# Patient Record
Sex: Female | Born: 1935 | Race: White | Hispanic: No | Marital: Married
Health system: Southern US, Community
[De-identification: ages and names within clinical notes are randomized; demographics above are authoritative.]

---

## 2008-10-16 ENCOUNTER — Ambulatory Visit: Payer: Self-pay | Admitting: Internal Medicine

## 2009-05-24 ENCOUNTER — Ambulatory Visit: Payer: Self-pay | Admitting: Unknown Physician Specialty

## 2010-02-07 ENCOUNTER — Ambulatory Visit: Payer: Self-pay | Admitting: Internal Medicine

## 2011-05-17 ENCOUNTER — Emergency Department: Payer: Self-pay | Admitting: Emergency Medicine

## 2012-07-14 ENCOUNTER — Emergency Department: Payer: Self-pay | Admitting: Emergency Medicine

## 2012-07-14 LAB — COMPREHENSIVE METABOLIC PANEL
Albumin: 3.4 g/dL (ref 3.4–5.0)
Alkaline Phosphatase: 126 U/L (ref 50–136)
Anion Gap: 8 (ref 7–16)
BUN: 13 mg/dL (ref 7–18)
Bilirubin,Total: 0.5 mg/dL (ref 0.2–1.0)
Creatinine: 0.63 mg/dL (ref 0.60–1.30)
EGFR (African American): >60
Glucose: 186 mg/dL — ABNORMAL HIGH (ref 65–99)
Osmolality: 279 (ref 275–301)
Potassium: 3.8 mmol/L (ref 3.5–5.1)
SGPT (ALT): 22 U/L (ref 12–78)
Sodium: 137 mmol/L (ref 136–145)

## 2012-07-14 LAB — CBC
HCT: 40.4 % (ref 35.0–47.0)
HGB: 13.7 g/dL (ref 12.0–16.0)
MCH: 33 pg (ref 26.0–34.0)
MCHC: 33.8 g/dL (ref 32.0–36.0)
MCV: 98 fL (ref 80–100)
Platelet: 272 10*3/uL (ref 150–440)
RBC: 4.14 10*6/uL (ref 3.80–5.20)
RDW: 12.7 % (ref 11.5–14.5)

## 2012-07-14 LAB — URINALYSIS, COMPLETE
Bilirubin,UR: NEGATIVE
Blood: NEGATIVE
Ketone: NEGATIVE
Nitrite: NEGATIVE
Protein: NEGATIVE
Squamous Epithelial: 1

## 2014-05-26 ENCOUNTER — Ambulatory Visit: Payer: Self-pay | Admitting: Internal Medicine

## 2014-06-09 ENCOUNTER — Emergency Department: Payer: Self-pay | Admitting: Emergency Medicine

## 2014-06-09 LAB — CBC WITH DIFFERENTIAL/PLATELET
BASOS ABS: 0 10*3/uL (ref 0.0–0.1)
Basophil %: 0.3 %
EOS ABS: 0.1 10*3/uL (ref 0.0–0.7)
Eosinophil %: 1.3 %
HCT: 40.7 % (ref 35.0–47.0)
HGB: 13.4 g/dL (ref 12.0–16.0)
LYMPHS ABS: 1.2 10*3/uL (ref 1.0–3.6)
Lymphocyte %: 13.7 %
MCH: 32.9 pg (ref 26.0–34.0)
MCHC: 32.8 g/dL (ref 32.0–36.0)
MCV: 100 fL (ref 80–100)
MONO ABS: 0.8 x10 3/mm (ref 0.2–0.9)
MONOS PCT: 9.4 %
NEUTROS ABS: 6.6 10*3/uL — AB (ref 1.4–6.5)
Neutrophil %: 75.3 %
PLATELETS: 220 10*3/uL (ref 150–440)
RBC: 4.06 10*6/uL (ref 3.80–5.20)
RDW: 12.8 % (ref 11.5–14.5)
WBC: 8.7 10*3/uL (ref 3.6–11.0)

## 2014-06-09 LAB — COMPREHENSIVE METABOLIC PANEL
Albumin: 3.4 g/dL (ref 3.4–5.0)
Alkaline Phosphatase: 97 U/L
Anion Gap: 3 — ABNORMAL LOW (ref 7–16)
BILIRUBIN TOTAL: 0.5 mg/dL (ref 0.2–1.0)
BUN: 16 mg/dL (ref 7–18)
CHLORIDE: 102 mmol/L (ref 98–107)
CREATININE: 0.85 mg/dL (ref 0.60–1.30)
Calcium, Total: 9 mg/dL (ref 8.5–10.1)
Co2: 32 mmol/L (ref 21–32)
EGFR (African American): >60
EGFR (Non-African Amer.): >60
Glucose: 204 mg/dL — ABNORMAL HIGH (ref 65–99)
Osmolality: 281 (ref 275–301)
Potassium: 4.3 mmol/L (ref 3.5–5.1)
SGOT(AST): 18 U/L (ref 15–37)
SGPT (ALT): 20 U/L
SODIUM: 137 mmol/L (ref 136–145)
Total Protein: 6.9 g/dL (ref 6.4–8.2)

## 2014-06-12 ENCOUNTER — Inpatient Hospital Stay: Payer: Self-pay | Admitting: Family Medicine

## 2014-06-12 LAB — URINALYSIS, COMPLETE
Bilirubin,UR: NEGATIVE
Blood: NEGATIVE
Glucose,UR: >=500 mg/dL (ref 0–75)
NITRITE: NEGATIVE
PROTEIN: NEGATIVE
Ph: 5 (ref 4.5–8.0)
RBC,UR: 33 /HPF (ref 0–5)
Specific Gravity: 1.023 (ref 1.003–1.030)
Squamous Epithelial: 4

## 2014-06-12 LAB — CBC WITH DIFFERENTIAL/PLATELET
BASOS ABS: 0 10*3/uL (ref 0.0–0.1)
Basophil %: 0.4 %
EOS ABS: 0.1 10*3/uL (ref 0.0–0.7)
Eosinophil %: 1.1 %
HCT: 42.7 % (ref 35.0–47.0)
HGB: 14.3 g/dL (ref 12.0–16.0)
LYMPHS PCT: 18.6 %
Lymphocyte #: 1.7 10*3/uL (ref 1.0–3.6)
MCH: 33.2 pg (ref 26.0–34.0)
MCHC: 33.4 g/dL (ref 32.0–36.0)
MCV: 99 fL (ref 80–100)
Monocyte #: 1.1 x10 3/mm — ABNORMAL HIGH (ref 0.2–0.9)
Monocyte %: 11.8 %
Neutrophil #: 6.3 10*3/uL (ref 1.4–6.5)
Neutrophil %: 68.1 %
Platelet: 225 10*3/uL (ref 150–440)
RBC: 4.29 10*6/uL (ref 3.80–5.20)
RDW: 12.7 % (ref 11.5–14.5)
WBC: 9.2 10*3/uL (ref 3.6–11.0)

## 2014-06-12 LAB — BASIC METABOLIC PANEL
ANION GAP: 6 — AB (ref 7–16)
BUN: 15 mg/dL (ref 7–18)
CALCIUM: 9.3 mg/dL (ref 8.5–10.1)
CO2: 30 mmol/L (ref 21–32)
Chloride: 101 mmol/L (ref 98–107)
Creatinine: 0.79 mg/dL (ref 0.60–1.30)
EGFR (African American): >60
EGFR (Non-African Amer.): >60
Glucose: 275 mg/dL — ABNORMAL HIGH (ref 65–99)
Osmolality: 284 (ref 275–301)
Potassium: 4.2 mmol/L (ref 3.5–5.1)
SODIUM: 137 mmol/L (ref 136–145)

## 2014-06-14 LAB — CBC WITH DIFFERENTIAL/PLATELET
Basophil #: 0 10*3/uL (ref 0.0–0.1)
Basophil %: 0.1 %
EOS PCT: 0.3 %
Eosinophil #: 0 10*3/uL (ref 0.0–0.7)
HCT: 43.2 % (ref 35.0–47.0)
HGB: 14.1 g/dL (ref 12.0–16.0)
LYMPHS PCT: 1.9 %
Lymphocyte #: 0.3 10*3/uL — ABNORMAL LOW (ref 1.0–3.6)
MCH: 32.9 pg (ref 26.0–34.0)
MCHC: 32.7 g/dL (ref 32.0–36.0)
MCV: 101 fL — AB (ref 80–100)
MONO ABS: 0.8 x10 3/mm (ref 0.2–0.9)
MONOS PCT: 4.8 %
Neutrophil #: 15.4 10*3/uL — ABNORMAL HIGH (ref 1.4–6.5)
Neutrophil %: 92.9 %
PLATELETS: 202 10*3/uL (ref 150–440)
RBC: 4.3 10*6/uL (ref 3.80–5.20)
RDW: 12.9 % (ref 11.5–14.5)
WBC: 16.6 10*3/uL — AB (ref 3.6–11.0)

## 2014-06-14 LAB — BASIC METABOLIC PANEL
Anion Gap: 8 (ref 7–16)
BUN: 14 mg/dL (ref 7–18)
CALCIUM: 8.7 mg/dL (ref 8.5–10.1)
Chloride: 103 mmol/L (ref 98–107)
Co2: 26 mmol/L (ref 21–32)
Creatinine: 0.84 mg/dL (ref 0.60–1.30)
EGFR (Non-African Amer.): >60
GLUCOSE: 209 mg/dL — AB (ref 65–99)
OSMOLALITY: 280 (ref 275–301)
Potassium: 4.5 mmol/L (ref 3.5–5.1)
Sodium: 137 mmol/L (ref 136–145)

## 2014-06-14 LAB — URINE CULTURE

## 2014-06-14 LAB — CLOSTRIDIUM DIFFICILE(ARMC)

## 2014-06-15 LAB — CBC WITH DIFFERENTIAL/PLATELET
BASOS ABS: 0 10*3/uL (ref 0.0–0.1)
Basophil %: 0.5 %
Eosinophil #: 0.1 10*3/uL (ref 0.0–0.7)
Eosinophil %: 1.5 %
HCT: 40.9 % (ref 35.0–47.0)
HGB: 13.6 g/dL (ref 12.0–16.0)
LYMPHS ABS: 1.2 10*3/uL (ref 1.0–3.6)
LYMPHS PCT: 16.3 %
MCH: 33.2 pg (ref 26.0–34.0)
MCHC: 33.2 g/dL (ref 32.0–36.0)
MCV: 100 fL (ref 80–100)
MONO ABS: 1 x10 3/mm — AB (ref 0.2–0.9)
Monocyte %: 13.3 %
NEUTROS ABS: 5.2 10*3/uL (ref 1.4–6.5)
Neutrophil %: 68.4 %
Platelet: 185 10*3/uL (ref 150–440)
RBC: 4.09 10*6/uL (ref 3.80–5.20)
RDW: 12.8 % (ref 11.5–14.5)
WBC: 7.6 10*3/uL (ref 3.6–11.0)

## 2014-06-15 LAB — VANCOMYCIN, TROUGH: Vancomycin, Trough: 11 ug/mL (ref 10–20)

## 2014-06-16 LAB — CBC WITH DIFFERENTIAL/PLATELET
BASOS PCT: 0.4 %
Basophil #: 0 10*3/uL (ref 0.0–0.1)
EOS ABS: 0.2 10*3/uL (ref 0.0–0.7)
EOS PCT: 2.5 %
HCT: 39.3 % (ref 35.0–47.0)
HGB: 13.2 g/dL (ref 12.0–16.0)
Lymphocyte #: 1.5 10*3/uL (ref 1.0–3.6)
Lymphocyte %: 18.7 %
MCH: 33.2 pg (ref 26.0–34.0)
MCHC: 33.5 g/dL (ref 32.0–36.0)
MCV: 99 fL (ref 80–100)
Monocyte #: 1.4 x10 3/mm — ABNORMAL HIGH (ref 0.2–0.9)
Monocyte %: 16.8 %
NEUTROS ABS: 5 10*3/uL (ref 1.4–6.5)
NEUTROS PCT: 61.6 %
Platelet: 180 10*3/uL (ref 150–440)
RBC: 3.96 10*6/uL (ref 3.80–5.20)
RDW: 12.6 % (ref 11.5–14.5)
WBC: 8.1 10*3/uL (ref 3.6–11.0)

## 2014-06-16 LAB — BASIC METABOLIC PANEL
Anion Gap: 4 — ABNORMAL LOW (ref 7–16)
BUN: 13 mg/dL (ref 7–18)
CALCIUM: 8.5 mg/dL (ref 8.5–10.1)
CREATININE: 0.77 mg/dL (ref 0.60–1.30)
Chloride: 105 mmol/L (ref 98–107)
Co2: 28 mmol/L (ref 21–32)
EGFR (African American): >60
EGFR (Non-African Amer.): >60
GLUCOSE: 127 mg/dL — AB (ref 65–99)
Osmolality: 276 (ref 275–301)
Potassium: 3.9 mmol/L (ref 3.5–5.1)
SODIUM: 137 mmol/L (ref 136–145)

## 2014-06-17 LAB — CBC WITH DIFFERENTIAL/PLATELET
Basophil #: 0 10*3/uL (ref 0.0–0.1)
Basophil %: 0.3 %
Eosinophil #: 0.1 10*3/uL (ref 0.0–0.7)
Eosinophil %: 1.5 %
HCT: 41 % (ref 35.0–47.0)
HGB: 13.7 g/dL (ref 12.0–16.0)
Lymphocyte #: 1.8 10*3/uL (ref 1.0–3.6)
Lymphocyte %: 21.3 %
MCH: 32.8 pg (ref 26.0–34.0)
MCHC: 33.3 g/dL (ref 32.0–36.0)
MCV: 99 fL (ref 80–100)
Monocyte #: 1.1 x10 3/mm — ABNORMAL HIGH (ref 0.2–0.9)
Monocyte %: 13.2 %
Neutrophil #: 5.4 10*3/uL (ref 1.4–6.5)
Neutrophil %: 63.7 %
Platelet: 205 10*3/uL (ref 150–440)
RBC: 4.16 10*6/uL (ref 3.80–5.20)
RDW: 12.9 % (ref 11.5–14.5)
WBC: 8.5 10*3/uL (ref 3.6–11.0)

## 2014-06-17 LAB — VANCOMYCIN, TROUGH: Vancomycin, Trough: 16 ug/mL (ref 10–20)

## 2014-06-19 ENCOUNTER — Encounter: Payer: Self-pay | Admitting: Internal Medicine

## 2014-06-19 LAB — WBC: WBC: 9.2 10*3/uL (ref 3.6–11.0)

## 2014-06-19 LAB — CREATININE, SERUM: Creatinine: 0.63 mg/dL (ref 0.60–1.30)

## 2014-06-26 ENCOUNTER — Ambulatory Visit: Payer: Self-pay | Admitting: Internal Medicine

## 2014-06-26 ENCOUNTER — Encounter: Payer: Self-pay | Admitting: Internal Medicine

## 2014-07-25 ENCOUNTER — Encounter
Admit: 2014-07-25 | Disposition: A | Discharge status: Home / Self Care | Payer: Self-pay | Attending: Internal Medicine | Admitting: Internal Medicine

## 2014-08-25 ENCOUNTER — Encounter
Admit: 2014-08-25 | Disposition: A | Discharge status: Home / Self Care | Payer: Self-pay | Attending: Internal Medicine | Admitting: Internal Medicine

## 2014-09-24 NOTE — H&P (Signed)
PATIENT NAME:  Alexis MaizesBOSWELL, Gray MR#:  [id number] DATE OF BIRTH:  Jun 23, 1920  DATE OF ADMISSION:  06/12/2014  PRIMARY CARE PHYSICIAN:  Dr. Burnadette PopLinthavong.   CHIEF COMPLAINT: Redness of face, weakness, and some confusion.   HISTORY OF PRESENT ILLNESS: A 81 year old Caucasian female patient with history of hypertension, diabetes, atrial fibrillation on Eliquis, who lives at independent living facility, has had redness of her face for about 2 weeks now. The patient was started on doxycycline and also has used some Keflex with no response. Her redness of the face is on both sides, covering all of her face. Does not have any oral ulcers or pain. This redness causes her pain, but no itching. No discharge. She mentions that she has not used any creams although she had her hair colored 2 weeks prior. Never had similar symptoms. No other rash in other areas. The patient has also been found to have a UTI on laboratory examination.   PAST MEDICAL HISTORY:  1. Hypertension.  2. Diabetes.  3. Hypothyroidism.  4. Hyperlipidemia.  5. Atrial fibrillation on Eliquis.    PAST SURGICAL HISTORY:  1. Appendectomy.  2. Hysterectomy.  3. Cholecystectomy.  4. Bilateral benign breast biopsies.  5. Partial thyroidectomy.   6. Bilateral cataract surgery.   ALLERGIES: ASPIRIN AND PENICILLIN.   FAMILY HISTORY: Heart disease.   SOCIAL HISTORY: The patient lives in independent living facility. She quit smoking 30 years back. No alcohol. No illicit drug use.   HOME MEDICATIONS:  1. Atenolol 25 mg half a tablet oral once a day.  2. Keflex 500 mg oral 4 times a day started on 06/03/2014.  3. Doxycycline 100 mg 2 times a day started on 06/06/2014.  4. Eliquis 2.5 mg 2 times a day.  5. Glimepiride 4 mg oral 2 times a day.  6. Levothyroxine 112 mcg daily.  7. Mupirocin topical ointment 2 times a day.  8. Omeprazole 20 mg daily.  9. Prednisone 20 mg daily started on 06/03/2014.  10. Simvastatin 10 mg daily.   REVIEW OF  SYSTEMS:    CONSTITUTIONAL: Complains of some fatigue.  EYES: No blurred vision, pain, or redness.   EARS, NOSE, AND THROAT:  No tinnitus, ear pain, hearing loss.  RESPIRATORY: No cough, wheeze, hemoptysis.  CARDIOVASCULAR: No chest pain, orthopnea, or edema.   GASTROINTESTINAL:  No nausea, vomiting, diarrhea,  GENITOURINARY: No dysuria, hematuria, frequency.   ENDOCRINE: No polyuria, nocturia, thyroid problems.  HEMATOLOGIC AND LYMPHATIC: No anemia, easy bruising.  INTEGUMENTARY: Has redness of her face.  MUSCULOSKELETAL: No back pain, arthritis.  NEUROLOGIC: No focal numbness.  PSYCHIATRIC: No anxiety or depression.   PHYSICAL EXAMINATION:  VITAL SIGNS: Temperature 98.6, pulse of 89, respirations 20, blood pressure 119/106, saturating 97% on room air.  GENERAL: Elderly Caucasian female patient lying in bed, seems comfortable, conversational, cooperative with exam.  PSYCHIATRIC: Alert and oriented x 3. Mood and affect appropriate. Judgment intact.  HEENT: Atraumatic, normocephalic. Mucous membranes are moist and pink. External ears and nose normal. Has redness on both sides of her face and extending into the neck, not involving her scalp or ears. No involvement of oral mucosa.  NECK: Supple. No thyromegaly. No palpable lymph nodes.  Trachea midline. No carotid bruit or JVD.   CARDIOVASCULAR: S1, S2, without any murmurs. Peripheral pulses 2 +. No edema. RESPIRATORY: Normal work of breathing. Clear to auscultation on both sides.  GASTROINTESTINAL: Soft abdomen, nontender. Bowel sounds present. No organomegaly palpable.  SKIN: Warm and dry. No petechiae, rash,  ulcers.  MUSCULOSKELETAL: No joint swelling, redness, effusion of the large joints. Normal muscle tone.  NEUROLOGIC:  Motor strength 5/5 in upper and lower extremities. Sensation is intact all over.  LYMPHATIC:  No cervical lymphadenopathy.    LABORATORY STUDIES: Glucose of 275, BUN 15, creatinine 0.79, sodium 137, potassium 4.2,  chloride 101, GFR greater than 60. WBC 9.8, hemoglobin 14.3, platelets 225,000. Urinalysis shows trace bacteria and 119 WBCs.   CT of the head without contrast shows nothing acute, has some atrophy age-related.   ASSESSMENT AND PLAN:  1.  Acute encephalopathy. The patient has had some episodes of confusion per daughter. This is likely from the urinary tract infection and the erysipelas the patient has. The patient will be on fall precautions. CT of the head shows nothing acute.  2.  Erysipelas of the face. The patient has significant redness although this might be contact dermatitis. At this point we will start her on vancomycin as she has failed outpatient antibiotics. 3.  Urinary tract infection. Start ceftriaxone and send for urine cultures.  4.  Hypertension. Continue medication.  5.  History of atrial fibrillation. Continue rate control medications and Eliquis.   6.  Deep vein thrombosis prophylaxis. The patient is on Eliquis.  7.  Diabetes mellitus. Put her on sliding scale insulin. Continue home glimepiride.    CODE STATUS: Full code.   TIME SPENT ON THIS CASE: 50 minutes.     ____________________________ Molinda Bailiff Linsey Arteaga, MD srs:bu D: 06/12/2014 17:54:51 ET T: 06/12/2014 18:21:36 ET JOB#: [id number]  cc: Wardell Heath R. Adiel Erney, MD, <Dictator> Marisue Ivan, MD Orie Fisherman MD ELECTRONICALLY SIGNED 06/21/2014 16:55

## 2014-09-24 NOTE — Discharge Summary (Signed)
PATIENT NAME:  Alexis Ayala, Alexis Ayala MR#:  [id number] DATE OF BIRTH:  April 26, 1921  DATE OF ADMISSION:  06/12/2014 DATE OF DISCHARGE:  06/19/2014   DISCHARGE DIAGNOSES:  1.  Erysipelas.  2.  Adult onset diabetes.  3.  Hyperlipidemia.  4.  Hypothyroidism.  5.  History of atrial fibrillation on Eliquis.   DISCHARGE MEDICATIONS:  1.  Atenolol 25 mg p.o. daily, but half a tablet.  2.  Eliquis 2.5 mg p.o. b.i.d.  3.  Glimepiride 4 mg p.o. b.i.d. with meals.  4.  Levothyroxine 112 mcg p.o. daily on an empty stomach in the morning.  5.  Omeprazole 20 mg p.o. daily 30 minutes prior to meal.  6.  Simvastatin 10 mg p.o. at bedtime.  7.  Doxycycline 100 mg p.o. b.i.d. x 3 more days.   CONSULTS: None.   PROCEDURES: None.  PERTINENT LABORATORY AND STUDIES: On day of discharge, creatinine was 0.63, white blood cell count 9.2. She did test negative for C. difficile. Urine culture showed contamination.   CODE STATUS. DO NOT RESUSCITATE.   BRIEF HOSPITAL COURSE:   Erysipelas. The patient initially came in with erysipelas of the face and neck that failed outpatient therapy due to poor compliance.  She was placed on vancomycin here in the hospital and improved within 72 hours.  She has been on vancomycin for a total of 7 days. Plan to transition to doxycycline today for 3 more days for total 10 days of antibiotic therapy. She was found to be weak and demented as well with a history of atrial fibrillation, hypothyroidism, hyperlipidemia and diabetes. Therefore, she met criteria for physical therapy and nursing care and will be transferred to [facility] for further rehab and nursing care. I spoke to her family. The family is in agreement with all of this.  Once she gets discharged, she is to follow up me in the clinic. Otherwise, she is stable for discharge.  Again, she is a DO NOT RESUSCITATE.      ____________________________ Dion Body, MD kl:DT D: 06/19/2014 10:57:37  ET T: 06/19/2014 11:45:06 ET JOB#: [id number]  cc: Dion Body, MD, <Dictator> Dion Body MD ELECTRONICALLY SIGNED 06/23/2014 10:20

## 2015-10-06 IMAGING — CT CT ORBITS WITH CONTRAST
3 of 6 series · 14 of 47 positions shown, 17 images · IV contrast (isovue)
Comparison: None.

CLINICAL DATA: Initial encounter for rash in the orbital region for
1 week.

EXAM:
CT ORBITS WITH CONTRAST
TECHNIQUE: Multidetector CT imaging of the orbits was performed following the
bolus administration of intravenous contrast.
CONTRAST:  60 cc Isovue 350

[Series 2: max soft · axial · 0.35mm/px · z∈[+358,+460]mm · 9 of 59 slices shown, 12 images]
[im 4/59  brain]
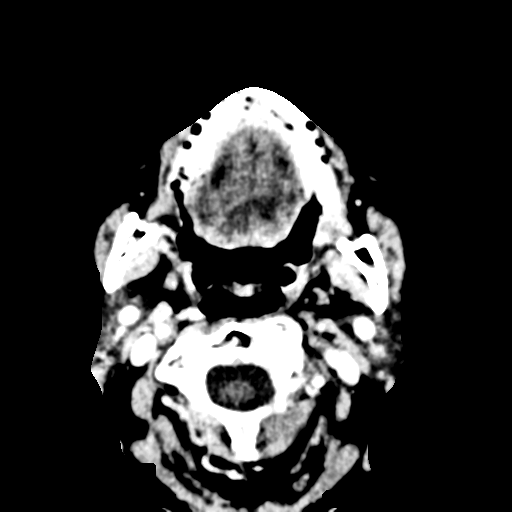
[im 4/59  bone]
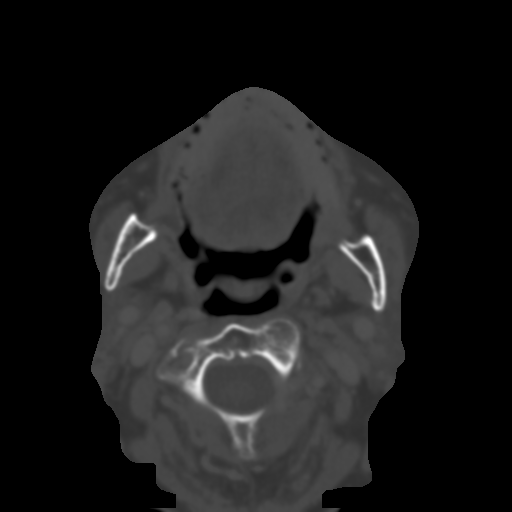
[im 11/59  bone]
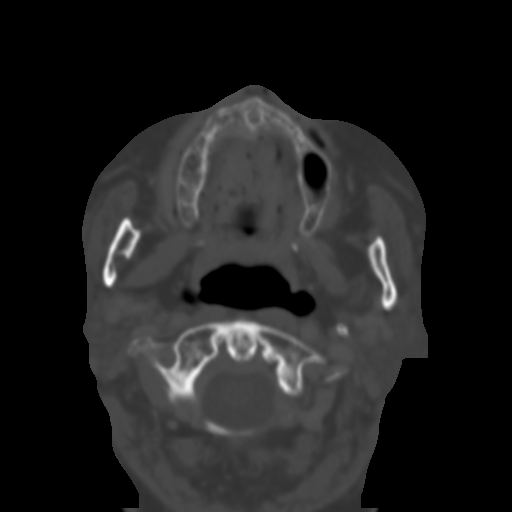
[im 18/59  bone]
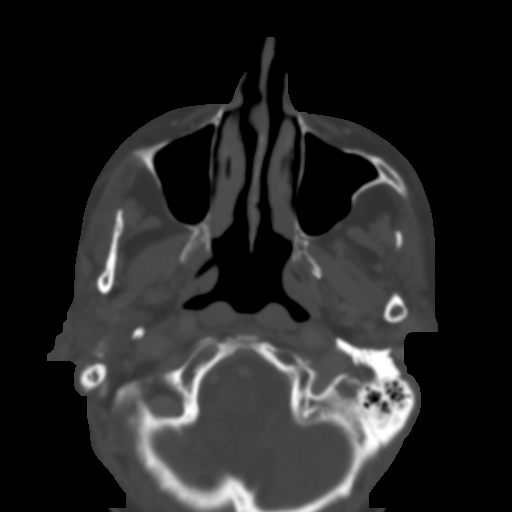
[im 24/59  bone]
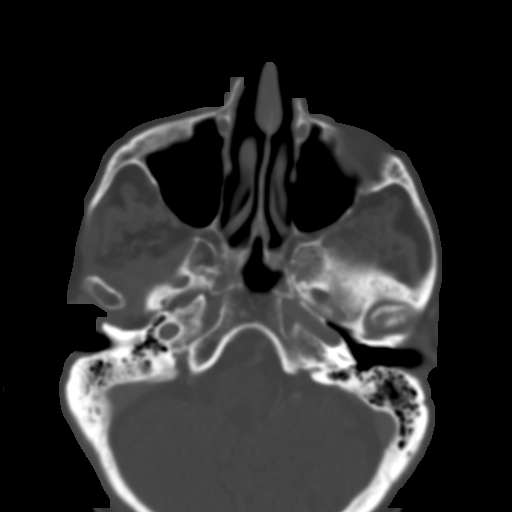
[im 31/59  brain]
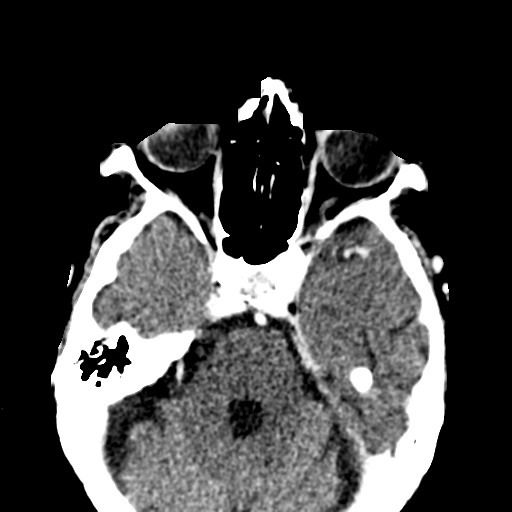
[im 31/59  bone]
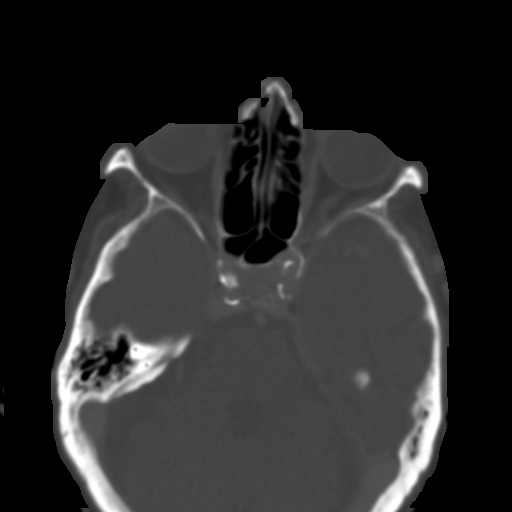
[im 35/59  bone]
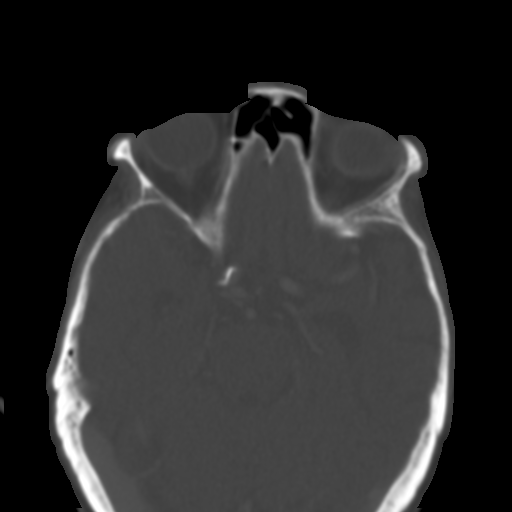
[im 41/59  bone]
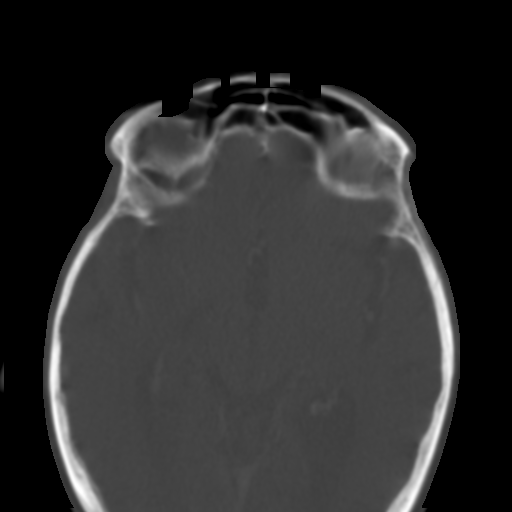
[im 48/59  bone]
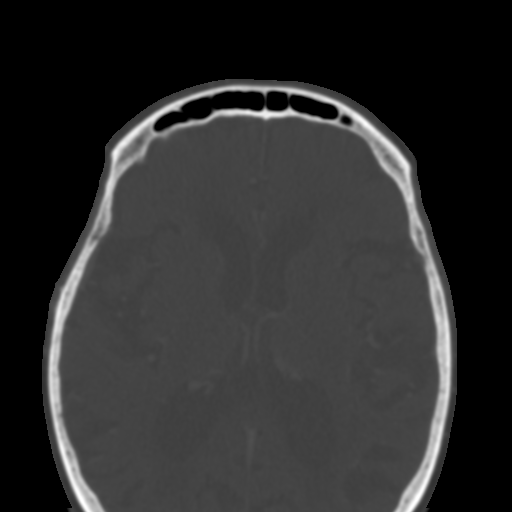
[im 55/59  brain]
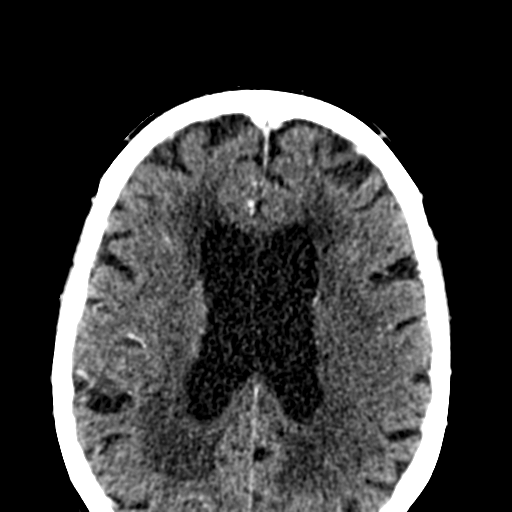
[im 55/59  bone]
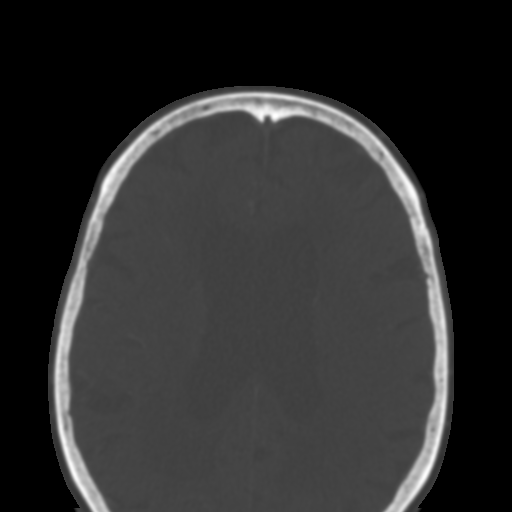

[Series 6: coronal soft · coronal · 0.23mm/px · 3 of 89 slices shown]
[im 23/89  bone]
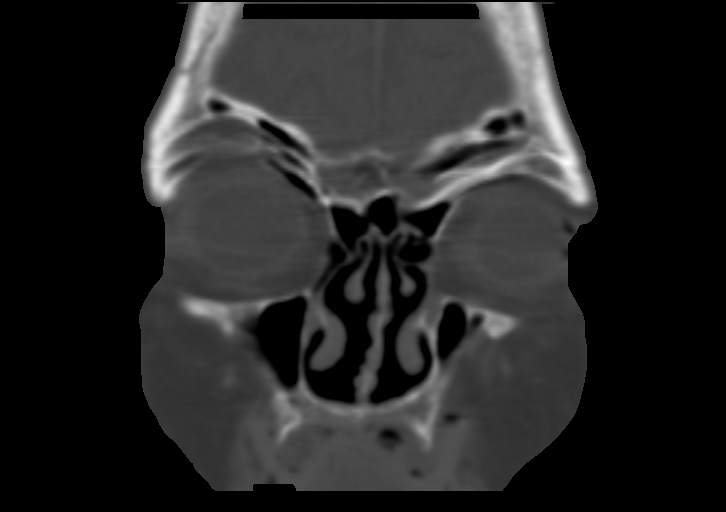
[im 45/89  bone]
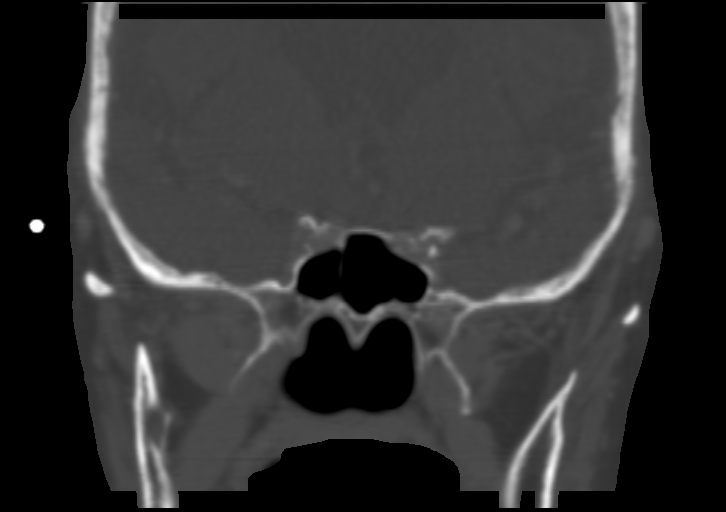
[im 67/89  bone]
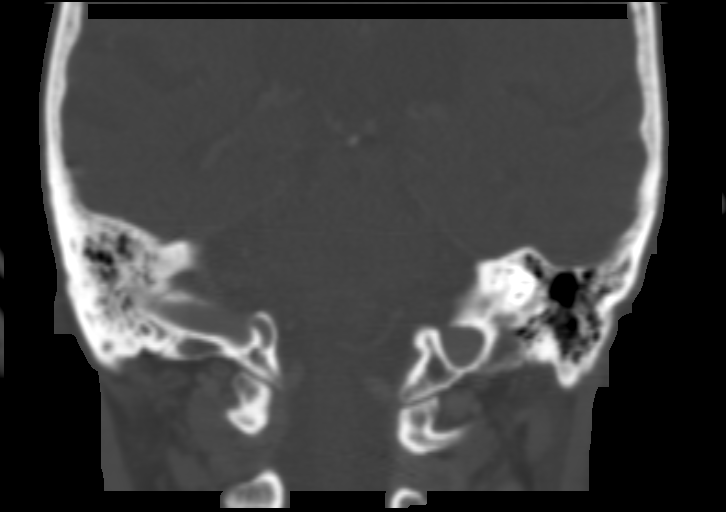

[Series 11: sagittal soft repeat · sagittal · 0.16mm/px · 2 of 86 slices shown]
[im 29/86  bone]
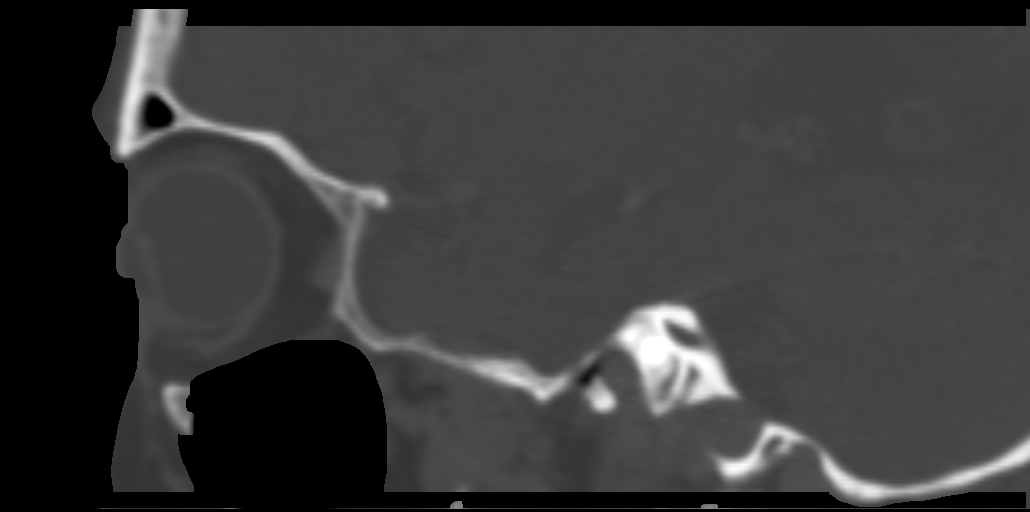
[im 57/86  bone]
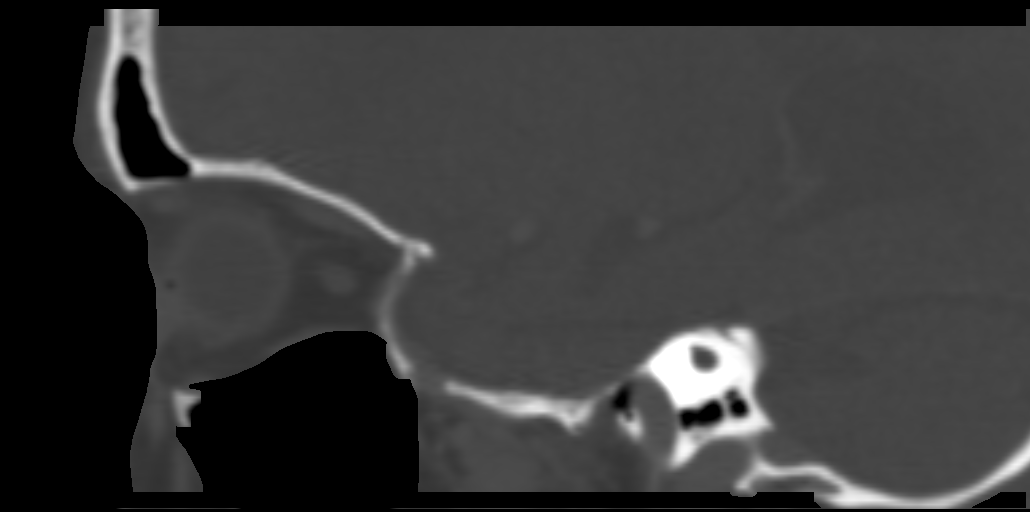

[14 of 47 positions shown; findings below may reference images not displayed]

FINDINGS: Images which include the lower brain show diffuse atrophy with
advanced chronic small vessel white matter ischemic demyelination.
Intraorbital fat is well preserved bilaterally with no evidence for
edema or inflammation. The globes are symmetric in size and shape.
Patient is status post bilateral lens replacement surgery.
IMPRESSION: No evidence for intraorbital edema or inflammation.
